# Patient Record
Sex: Male | Born: 1985 | Race: Black or African American | Hispanic: No | Marital: Single | Smoking: Never smoker
Health system: Southern US, Community
[De-identification: ages and names within clinical notes are randomized; demographics above are authoritative.]

## PROBLEM LIST (undated history)

## (undated) ENCOUNTER — Emergency Department (HOSPITAL_COMMUNITY): Payer: Self-pay

---

## 2013-05-16 ENCOUNTER — Emergency Department (INDEPENDENT_AMBULATORY_CARE_PROVIDER_SITE_OTHER)
Admission: EM | Admit: 2013-05-16 | Discharge: 2013-05-16 | Disposition: A | Discharge status: Home / Self Care | Payer: Self-pay | Source: Home / Self Care | Attending: Family Medicine | Admitting: Family Medicine

## 2013-05-16 ENCOUNTER — Encounter (HOSPITAL_COMMUNITY): Payer: Self-pay | Admitting: Emergency Medicine

## 2013-05-16 DIAGNOSIS — R1110 Vomiting, unspecified: Secondary | ICD-10-CM

## 2013-05-16 DIAGNOSIS — R197 Diarrhea, unspecified: Secondary | ICD-10-CM

## 2013-05-16 MED ORDER — ONDANSETRON 4 MG PO TBDP: 4.0000 mg | ORAL_TABLET | Freq: Three times a day (TID) | ORAL | Status: AC | PRN

## 2013-05-16 MED ORDER — ONDANSETRON 4 MG PO TBDP
ORAL_TABLET | ORAL | Status: AC
  Filled 2013-05-16: qty 2

## 2013-05-16 MED ORDER — ONDANSETRON 4 MG PO TBDP
8.0000 mg | ORAL_TABLET | Freq: Once | ORAL | Status: AC
  Administered 2013-05-16: 8 mg via ORAL

## 2013-05-16 MED ORDER — DICYCLOMINE HCL 20 MG PO TABS: 20.0000 mg | ORAL_TABLET | Freq: Two times a day (BID) | ORAL | Status: AC

## 2013-05-16 NOTE — ED Notes (Signed)
Patient in bathroom

## 2013-05-16 NOTE — ED Notes (Signed)
At discharge patient questioned what to do for abdominal pain -spoke to Langston Masker, pa-received script for bentyl.

## 2013-05-16 NOTE — ED Notes (Signed)
Patient sipping on ice water-did not want gatorade

## 2013-05-16 NOTE — ED Provider Notes (Signed)
Medical screening examination/treatment/procedure(s) were performed by a resident physician or non-physician practitioner and as the supervising physician I was immediately available for consultation/collaboration.  Clementeen Graham, MD   Rodolph Bong, MD 05/16/13 [phone]

## 2013-05-16 NOTE — ED Notes (Signed)
Reports feeling fine yesterday.  This am woke around 4:00 am with abdominal pain, nausea, vomiting.

## 2013-05-16 NOTE — ED Notes (Signed)
Patient came back in the building to get a work note

## 2013-05-16 NOTE — ED Provider Notes (Signed)
CSN: [id number]     Arrival date & time 05/16/13  4098 History   First MD Initiated Contact with Patient 05/16/13 0840     Chief Complaint  Patient presents with  . Emesis   (Consider location/radiation/quality/duration/timing/severity/associated sxs/prior Treatment) Patient is a 27 y.o. male presenting with vomiting. The history is provided by the patient. No language interpreter was used.  Emesis Severity:  Moderate Duration:  4 hours Timing:  Constant Number of daily episodes:  Multipe Progression:  Worsening Chronicity:  New Recent urination:  Normal Associated symptoms: abdominal pain and diarrhea    Pt complains of vomitting and diarrhea that started this am.   Pt thinks he has food poisoning  (pizza)    History reviewed. No pertinent past medical history. History reviewed. No pertinent past surgical history. No family history on file. History  Substance Use Topics  . Smoking status: Never Smoker   . Smokeless tobacco: Not on file  . Alcohol Use: No    Review of Systems  Gastrointestinal: Positive for vomiting, abdominal pain and diarrhea.  All other systems reviewed and are negative.    Allergies  Review of patient's allergies indicates no known allergies.  Home Medications   Current Outpatient Rx  Name  Route  Sig  Dispense  Refill  . dicyclomine (BENTYL) 20 MG tablet   Oral   Take 1 tablet (20 mg total) by mouth 2 (two) times daily.   20 tablet   0   . ondansetron (ZOFRAN ODT) 4 MG disintegrating tablet   Oral   Take 1 tablet (4 mg total) by mouth every 8 (eight) hours as needed for nausea.   10 tablet   0    BP 160/101  Pulse 92  Temp(Src) 98.2 F (36.8 C) (Oral)  Resp 16  SpO2 98% Physical Exam  Nursing note and vitals reviewed. Constitutional: He is oriented to person, place, and time. He appears well-developed and well-nourished.  HENT:  Head: Normocephalic and atraumatic.  Right Ear: External ear normal.  Left Ear: External ear normal.   Nose: Nose normal.  Mouth/Throat: Oropharynx is clear and moist.  Eyes: Conjunctivae and EOM are normal. Pupils are equal, round, and reactive to light.  Neck: Normal range of motion. Neck supple.  Cardiovascular: Normal rate, regular rhythm and normal heart sounds.   Pulmonary/Chest: Effort normal and breath sounds normal.  Abdominal: Soft. Bowel sounds are normal.  Musculoskeletal: Normal range of motion.  Neurological: He is alert and oriented to person, place, and time.  Skin: Skin is warm.  Psychiatric: He has a normal mood and affect.    ED Course  Procedures (including critical care time) Labs Review Labs Reviewed - No data to display Imaging Review No results found.  MDM   1. Vomiting   2. Diarrhea    Pt feels better after zofran.   Pt given rx for zofran and bentyl for cramping.   Pt advised to recheck if symptoms worsen or persist longer than 24 hours    Elson Areas, [place] 05/16/13 1157

## 2014-09-13 ENCOUNTER — Encounter (HOSPITAL_COMMUNITY): Payer: Self-pay | Admitting: *Deleted

## 2014-09-13 ENCOUNTER — Emergency Department (HOSPITAL_COMMUNITY): Payer: Self-pay

## 2014-09-13 ENCOUNTER — Emergency Department (HOSPITAL_COMMUNITY)
Admission: EM | Admit: 2014-09-13 | Discharge: 2014-09-13 | Disposition: A | Discharge status: Home / Self Care | Payer: Self-pay | Attending: Emergency Medicine | Admitting: Emergency Medicine

## 2014-09-13 DIAGNOSIS — Y998 Other external cause status: Secondary | ICD-10-CM | POA: Insufficient documentation

## 2014-09-13 DIAGNOSIS — X58XXXA Exposure to other specified factors, initial encounter: Secondary | ICD-10-CM | POA: Insufficient documentation

## 2014-09-13 DIAGNOSIS — Z79899 Other long term (current) drug therapy: Secondary | ICD-10-CM | POA: Insufficient documentation

## 2014-09-13 DIAGNOSIS — Y9289 Other specified places as the place of occurrence of the external cause: Secondary | ICD-10-CM | POA: Insufficient documentation

## 2014-09-13 DIAGNOSIS — S46912A Strain of unspecified muscle, fascia and tendon at shoulder and upper arm level, left arm, initial encounter: Secondary | ICD-10-CM | POA: Insufficient documentation

## 2014-09-13 DIAGNOSIS — Y9389 Activity, other specified: Secondary | ICD-10-CM | POA: Insufficient documentation

## 2014-09-13 MED ORDER — NAPROXEN 375 MG PO TABS: 375.0000 mg | ORAL_TABLET | Freq: Three times a day (TID) | ORAL | Status: AC

## 2014-09-13 MED ORDER — CYCLOBENZAPRINE HCL 5 MG PO TABS: 5.0000 mg | ORAL_TABLET | Freq: Three times a day (TID) | ORAL | Status: AC

## 2014-09-13 MED ORDER — CYCLOBENZAPRINE HCL 10 MG PO TABS
5.0000 mg | ORAL_TABLET | Freq: Once | ORAL | Status: AC
  Administered 2014-09-13: 5 mg via ORAL
  Filled 2014-09-13: qty 1

## 2014-09-13 MED ORDER — KETOROLAC TROMETHAMINE 30 MG/ML IJ SOLN
30.0000 mg | Freq: Once | INTRAMUSCULAR | Status: AC
  Administered 2014-09-13: 30 mg via INTRAMUSCULAR
  Filled 2014-09-13: qty 1

## 2014-09-13 NOTE — ED Provider Notes (Signed)
CSN: [id number]     Arrival date & time 09/13/14  1808 History   First MD Initiated Contact with Patient 09/13/14 2003     Chief Complaint  Patient presents with  . Shoulder Pain     (Consider location/radiation/quality/duration/timing/severity/associated sxs/prior Treatment) HPI Comments: Lifting a heavy gas can felt pain posterior L should area pain has persistent Took 1 Ibuprofen without relief   Patient is a 29 y.o. male presenting with shoulder pain. The history is provided by the patient.  Shoulder Pain Location:  Shoulder Time since incident:  6 hours Injury: yes   Mechanism of injury comment:  Lifting Shoulder location:  L shoulder Pain details:    Quality:  Aching   Radiates to:  Does not radiate   Severity:  Moderate   Onset quality:  Sudden   Duration:  6 hours   Timing:  Constant   Progression:  Unchanged Chronicity:  New Handedness:  Right-handed Dislocation: no   Prior injury to area:  No Relieved by:  Nothing Worsened by:  Movement Ineffective treatments:  NSAIDs Associated symptoms: back pain and swelling   Associated symptoms: no fever     History reviewed. No pertinent past medical history. History reviewed. No pertinent past surgical history. History reviewed. No pertinent family history. History  Substance Use Topics  . Smoking status: Never Smoker   . Smokeless tobacco: Not on file  . Alcohol Use: No    Review of Systems  Constitutional: Negative for fever.  Musculoskeletal: Positive for back pain. Negative for joint swelling.  Neurological: Negative for numbness.  All other systems reviewed and are negative.     Allergies  Review of patient's allergies indicates no known allergies.  Home Medications   Prior to Admission medications   Medication Sig Start Date End Date Taking? Authorizing Provider  cyclobenzaprine (FLEXERIL) 5 MG tablet Take 1 tablet (5 mg total) by mouth 3 (three) times daily. 09/13/14 09/16/14  Arman FilterGail K Evin Chirco, NP    dicyclomine (BENTYL) 20 MG tablet Take 1 tablet (20 mg total) by mouth 2 (two) times daily. 05/16/13   Elson AreasLeslie K Sofia, PA-C  naproxen (NAPROSYN) 375 MG tablet Take 1 tablet (375 mg total) by mouth 3 (three) times daily with meals. 09/13/14   Arman FilterGail K Nikaya Nasby, NP  ondansetron (ZOFRAN ODT) 4 MG disintegrating tablet Take 1 tablet (4 mg total) by mouth every 8 (eight) hours as needed for nausea. 05/16/13   Elson AreasLeslie K Sofia, PA-C   BP 123/82 mmHg  Pulse 68  Temp(Src) 98 F (36.7 C) (Oral)  Resp 18  SpO2 99% Physical Exam  Constitutional: He is oriented to person, place, and time. He appears well-developed and well-nourished. No distress.  HENT:  Head: Normocephalic.  Eyes: Pupils are equal, round, and reactive to light.  Neck: Normal range of motion.  Cardiovascular: Normal rate.   Musculoskeletal: He exhibits tenderness. He exhibits no edema.       Back:  Neurological: He is alert and oriented to person, place, and time.  Skin: Skin is warm.  Nursing note and vitals reviewed.   ED Course  Procedures (including critical care time) Labs Review Labs Reviewed - No data to display  Imaging Review Dg Shoulder Left  09/13/2014   EXAM: LEFT SHOULDER - 2+ VIEW  COMPARISON:  None.  FINDINGS: Glenohumeral joint is intact. No evidence of scapular fracture or humeral fracture. The acromioclavicular joint is intact.  IMPRESSION: No fracture or dislocation.   Electronically Signed   By: Roseanne RenoStewart  Amil Amen M.D.   On: 09/13/2014 19:18     EKG Interpretation None      MDM   Final diagnoses:  Muscle strain of scapular region, left, initial encounter       Arman Filter, NP 09/13/14 2023  Juliet Rude. Rubin Payor, MD 09/13/14 2350

## 2014-09-13 NOTE — ED Notes (Signed)
Pt in c/o left shoulder pain that started while lifting something heavy, pain worse with movement, no deformity noted

## 2014-09-13 NOTE — Discharge Instructions (Signed)
Cryotherapy Cryotherapy is when you put ice on your injury. Ice helps lessen pain and puffiness (swelling) after an injury. Ice works the best when you start using it in the first 24 to 48 hours after an injury. HOME CARE  Put a dry or damp towel between the ice pack and your skin.  You may press gently on the ice pack.  Leave the ice on for no more than 10 to 20 minutes at a time.  Check your skin after 5 minutes to make sure your skin is okay.  Rest at least 20 minutes between ice pack uses.  Stop using ice when your skin loses feeling (numbness).  Do not use ice on someone who cannot tell you when it hurts. This includes small children and people with memory problems (dementia). GET HELP RIGHT AWAY IF:  You have white spots on your skin.  Your skin turns blue or pale.  Your skin feels waxy or hard.  Your puffiness gets worse. MAKE SURE YOU:   Understand these instructions.  Will watch your condition.  Will get help right away if you are not doing well or get worse. Document Released: 02/08/2008 Document Revised: 11/14/2011 Document Reviewed: 04/14/2011 [facility] Patient Information 2015 [place]. This information is not intended to replace advice given to you by your health care provider. Make sure you discuss any questions you have with your health care provider. Use the ice therapy for the first 24 hours than the heat

## 2014-09-13 NOTE — ED Notes (Signed)
The pt is in xray and will return to fast track

## 2015-08-17 IMAGING — DX DG SHOULDER 2+V*L*
3 series · 3 of 3 positions shown · non-contrast
Comparison: None.

EXAM:
LEFT SHOULDER - 2+ VIEW

[shoulder grashey]
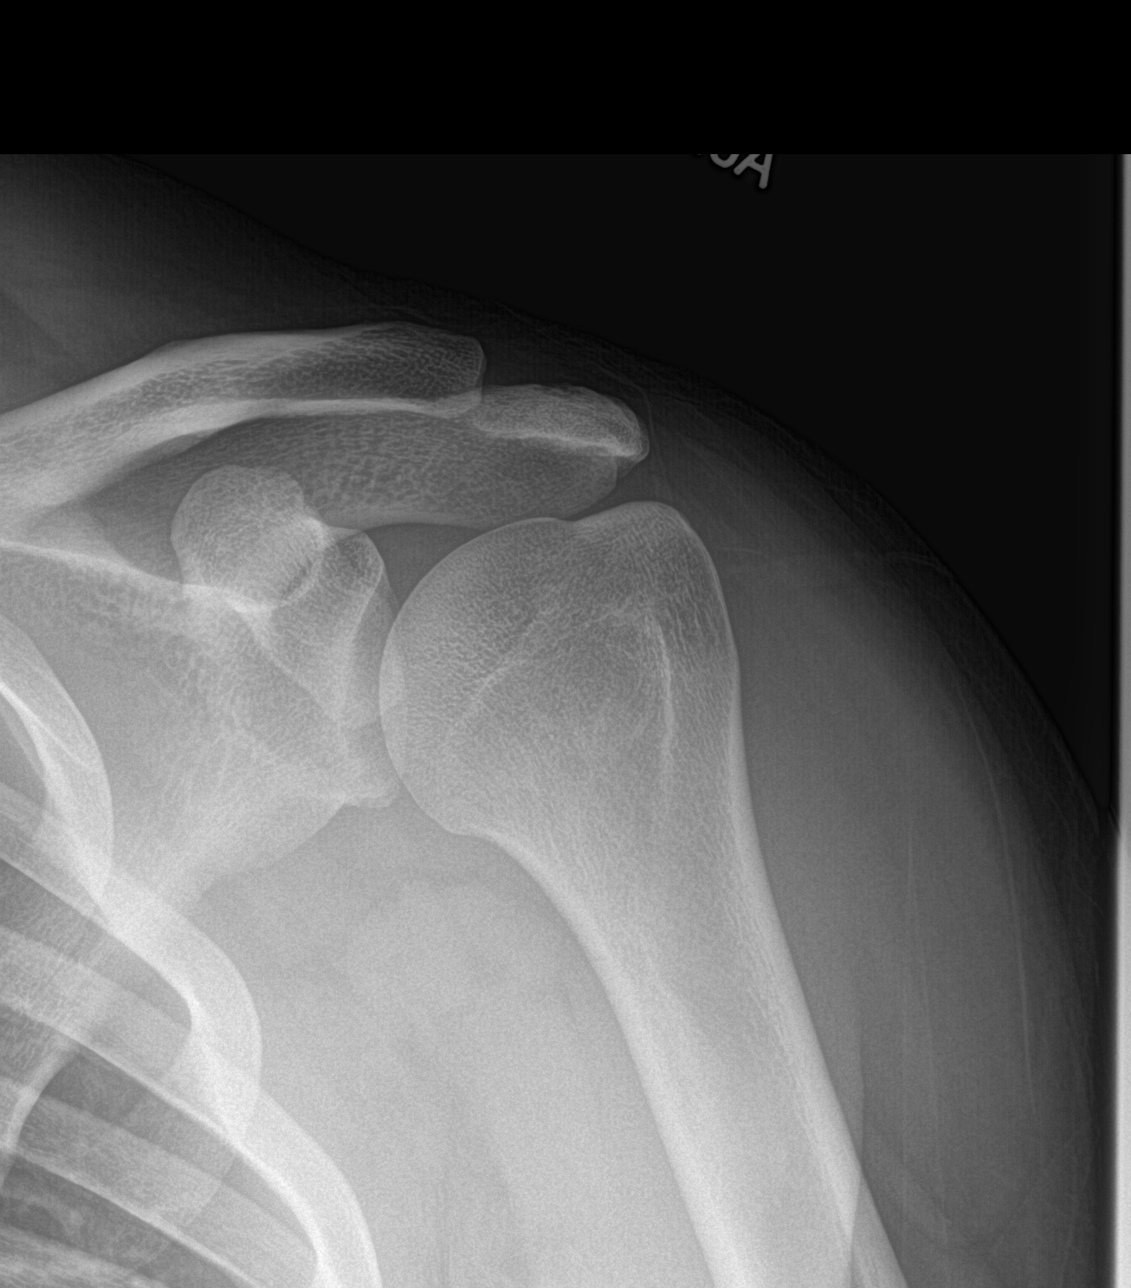

[shoulder y view]
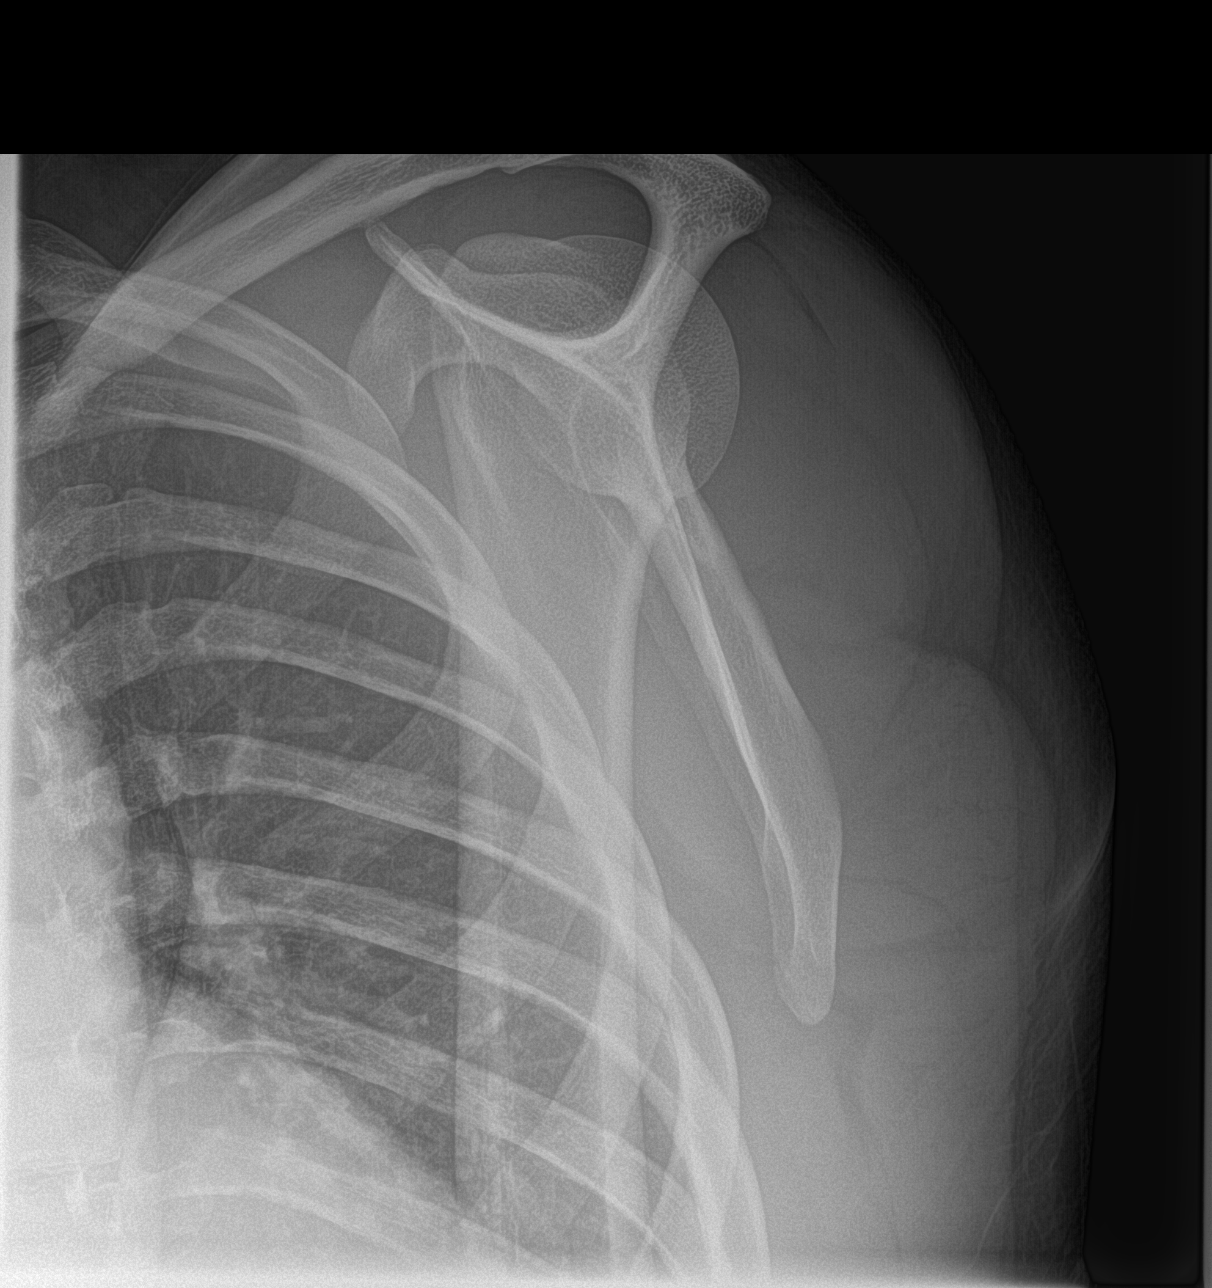

[shoulder axillary]
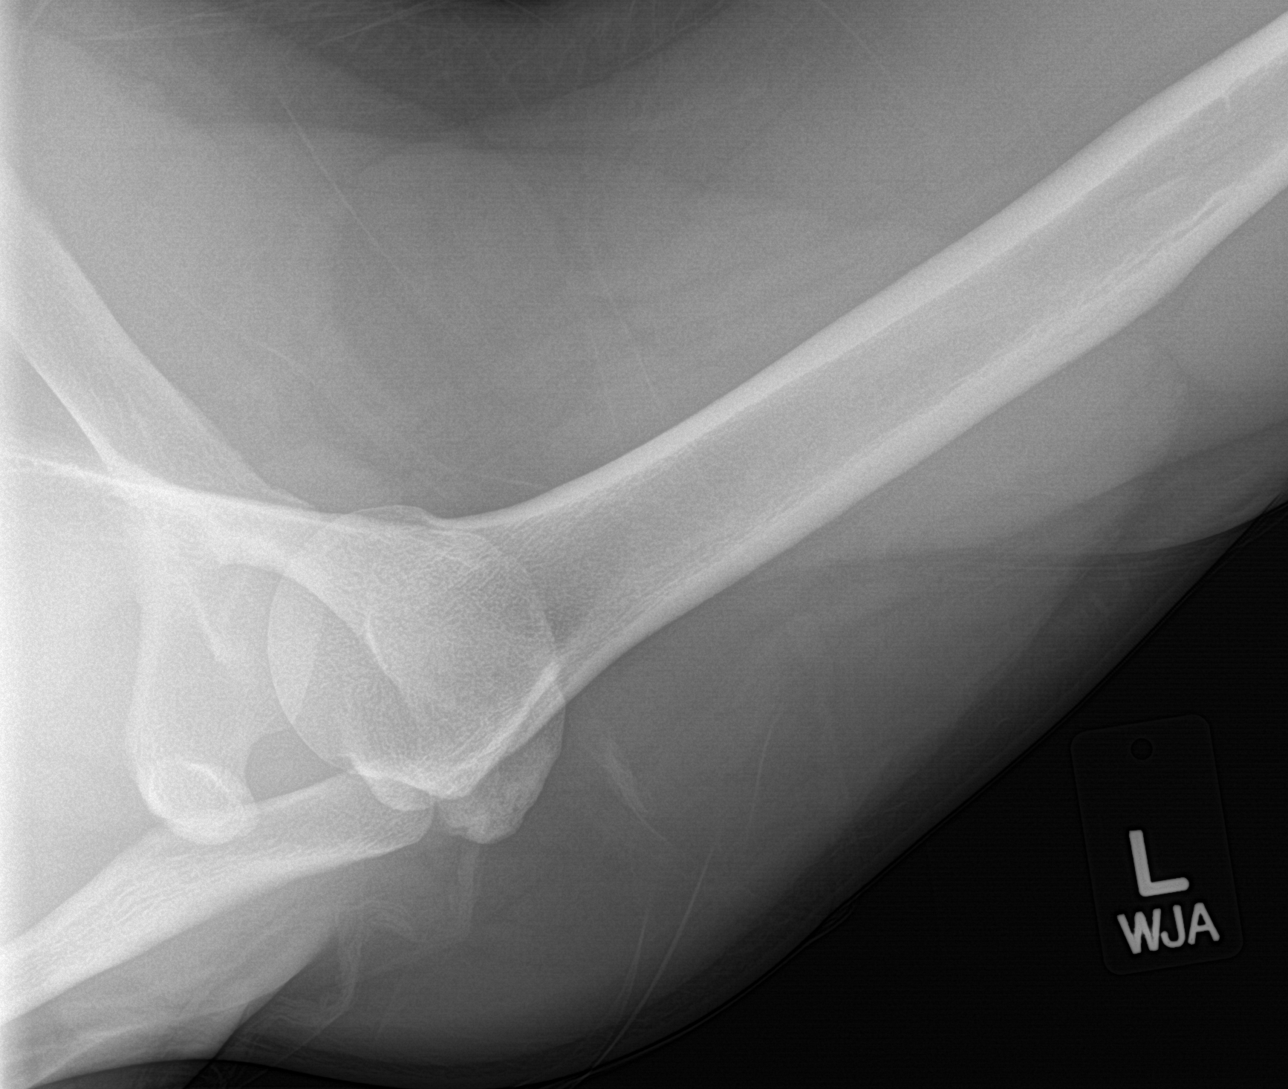

[3 of 3 positions shown; findings below may reference images not displayed]

FINDINGS: Glenohumeral joint is intact. No evidence of scapular fracture or
humeral fracture. The acromioclavicular joint is intact.
IMPRESSION: No fracture or dislocation.
